# Patient Record
Sex: Male | Born: 1985 | Race: White | Hispanic: No | Marital: Single | Smoking: Former smoker
Health system: Southern US, Community
[De-identification: ages and names within clinical notes are randomized; demographics above are authoritative.]

## PROBLEM LIST (undated history)

## (undated) DIAGNOSIS — E785 Hyperlipidemia, unspecified: Secondary | ICD-10-CM

## (undated) HISTORY — DX: Hyperlipidemia, unspecified: E78.5

---

## 2018-06-14 ENCOUNTER — Ambulatory Visit: Payer: BLUE CROSS/BLUE SHIELD | Admitting: Neurology

## 2018-06-14 ENCOUNTER — Encounter: Payer: Self-pay | Admitting: Neurology

## 2018-06-14 VITALS — BP 136/88 | HR 92 | Ht 74.5 in | Wt 204.0 lb

## 2018-06-14 DIAGNOSIS — R0683 Snoring: Secondary | ICD-10-CM | POA: Diagnosis not present

## 2018-06-14 DIAGNOSIS — Z820 Family history of epilepsy and other diseases of the nervous system: Secondary | ICD-10-CM | POA: Diagnosis not present

## 2018-06-14 DIAGNOSIS — E663 Overweight: Secondary | ICD-10-CM

## 2018-06-14 DIAGNOSIS — R0681 Apnea, not elsewhere classified: Secondary | ICD-10-CM | POA: Diagnosis not present

## 2018-06-14 NOTE — Patient Instructions (Signed)

## 2018-06-14 NOTE — Progress Notes (Signed)
Subjective:    Patient ID: Gabriel Smith is a 32 y.o. male.  HPI     Huston Foley, MD, PhD [facility] [address], [address] [address] [place], [place] [address]  Dear Dr. Gwinda Passe,   I saw your patient, [name], upon your kind request, in my neurologic clinic today for initial consultation of his sleep disorder, in particular, concern for underlying obstructive sleep apnea. The patient is unaccompanied today. As you know, [name] is a 32 year old right-handed gentleman with an underlying medical history of hyperlipidemia and borderline overweight state, who reports snoring and excessive daytime somnolence as well as witnessed breathing pauses while asleep per girlfriend. I reviewed your office note from 04/08/2018. His parents have sleep apnea, his maternal grandfather has sleep apnea. His Epworth sleepiness score is 3 out of 24, fatigue score is 17 out of 63. He is single and lives alone. He is a IT trainer. He has no children. He smokes marijuana about 4 times per month, he occasionally smoked cigarettes in the past, quit smoking altogether in 2016. He drinks alcohol in the form of beer, approximately 24 beer per weekend. He drinks caffeine in the form of coffee, one cup per day on average. He denies restless leg symptoms. He denies morning headaches. He does report nocturia about once or twice per average night. He has been working on weight loss and has lost about 10 pounds in the past 6 months. He believes that his snoring has improved since then.  His Past Medical History Is Significant For: Past Medical History:  Diagnosis Date  . Hyperlipidemia     His Past Surgical History Is Significant For:  His Family History Is Significant For: Family History  Problem Relation Age of Onset  . Stroke Mother   . Depression Mother   . Sleep apnea Mother   . Heart attack Father   . Sleep apnea Father     His Social History Is Significant For: Social History    Socioeconomic History  . Marital status: Single    Spouse name: Not on file  . Number of children: Not on file  . Years of education: Not on file  . Highest education level: Not on file  Occupational History  . Not on file  Social Needs  . Financial resource strain: Not on file  . Food insecurity:    Worry: Not on file    Inability: Not on file  . Transportation needs:    Medical: Not on file    Non-medical: Not on file  Tobacco Use  . Smoking status: Former Smoker    Types: Cigarettes  . Smokeless tobacco: Never Used  Substance and Sexual Activity  . Alcohol use: Yes    Alcohol/week: 14.4 oz    Types: 24 Standard drinks or equivalent per week  . Drug use: Yes    Types: Marijuana  . Sexual activity: Not on file  Lifestyle  . Physical activity:    Days per week: Not on file    Minutes per session: Not on file  . Stress: Not on file  Relationships  . Social connections:    Talks on phone: Not on file    Gets together: Not on file    Attends religious service: Not on file    Active member of club or organization: Not on file    Attends meetings of clubs or organizations: Not on file    Relationship status: Not on file  Other Topics Concern  .  Not on file  Social History Narrative  . Not on file    His Allergies Are:  No Known Allergies:   His Current Medications Are:  Outpatient Encounter Medications as of 06/14/2018  Medication Sig  . atorvastatin (LIPITOR) 10 MG tablet Take 10 mg by mouth daily.  . Omega-3 Fatty Acids (FISH OIL) 1000 MG CAPS Take 1,000 mg by mouth daily.   No facility-administered encounter medications on file as of 06/14/2018.   :  Review of Systems:  Out of a complete 14 point review of systems, all are reviewed and negative with the exception of these symptoms as listed below:  Review of Systems  Neurological:       Pt presents today to discuss his sleep. Pt has never had a sleep study. Pt has been told that he stops breathing while  he is sleeping and that he occasionally snores.  Epworth Sleepiness Scale 0= would never doze 1= slight chance of dozing 2= moderate chance of dozing 3= high chance of dozing  Sitting and reading: 0 Watching TV: 0 Sitting inactive in a public place (ex. Theater or meeting): 0 As a passenger in a car for an hour without a break: 1 Lying down to rest in the afternoon: 1 Sitting and talking to someone: 0 Sitting quietly after lunch (no alcohol): 1 In a car, while stopped in traffic: 0 Total: 3     Objective:  Neurological Exam  Physical Exam Physical Examination:   Vitals:   06/14/18 1602  BP: 136/88  Pulse: 92   General Examination: The patient is a very pleasant 32 y.o. male in no acute distress. He appears well-developed and well-nourished and well groomed.   HEENT: Normocephalic, atraumatic, pupils are equal, round and reactive to light and accommodation. Extraocular tracking is good without limitation to gaze excursion or nystagmus noted. Normal smooth pursuit is noted. Hearing is grossly intact. Face is symmetric with normal facial animation and normal facial sensation. Speech is clear with no dysarthria noted. There is no hypophonia. There is no lip, neck/head, jaw or voice tremor. Neck is supple with full range of passive and active motion. There are no carotid bruits on auscultation. Oropharynx exam reveals: mild mouth dryness, good dental hygiene and mild airway crowding, due to smaller airway opening, tonsils are 1+. Mallampati is class II. Neck circumference is 15-1/4 inches. He has a minimal to mild overbite. tongue protrudes centrally and palate elevates symmetrically.   Chest: Clear to auscultation without wheezing, rhonchi or crackles noted.  Heart: S1+S2+0, regular and normal without murmurs, rubs or gallops noted.   Abdomen: Soft, non-tender and non-distended with normal bowel sounds appreciated on auscultation.  Extremities: There is no pitting edema in the  distal lower extremities bilaterally.   Skin: Warm and dry without trophic changes noted.  Musculoskeletal: exam reveals no obvious joint deformities, tenderness or joint swelling or erythema.   Neurologically:  Mental status: The patient is awake, alert and oriented in all 4 spheres. His immediate and remote memory, attention, language skills and fund of knowledge are appropriate. There is no evidence of aphasia, agnosia, apraxia or anomia. Speech is clear with normal prosody and enunciation. Thought process is linear. Mood is normal and affect is normal.  Cranial nerves II - XII are as described above under HEENT exam. In addition: shoulder shrug is normal with equal shoulder height noted. Motor exam: Normal bulk, strength and tone is noted. There is no drift, tremor or rebound. Romberg is negative. Reflexes are  2+ throughout. Fine motor skills and coordination: intact with normal finger taps, normal hand movements, normal rapid alternating patting, normal foot taps and normal foot agility.  Cerebellar testing: No dysmetria or intention tremor on finger to nose testing. Heel to shin is unremarkable bilaterally. There is no truncal or gait ataxia.  Sensory exam: intact to light touch in the upper and lower extremities.  Gait, station and balance: He stands easily. No veering to one side is noted. No leaning to one side is noted. Posture is age-appropriate and stance is narrow based. Gait shows normal stride length and normal pace. No problems turning are noted. Tandem walk is unremarkable.   Assessment and Plan:  In summary, Jarrin Staley is a very pleasant 32 y.o.-year old male with a history and physical exam concerning for obstructive sleep apnea (OSA). I had a long chat with the patient about my findings and the diagnosis of OSA, its prognosis and treatment options. We talked about medical treatments, surgical interventions and non-pharmacological approaches. I explained in particular the risks  and ramifications of untreated moderate to severe OSA, especially with respect to developing cardiovascular disease down the Road, including congestive heart failure, difficult to treat hypertension, cardiac arrhythmias, or stroke. Even type 2 diabetes has, in part, been linked to untreated OSA. Symptoms of untreated OSA include daytime sleepiness, memory problems, mood irritability and mood disorder such as depression and anxiety, lack of energy, as well as recurrent headaches, especially morning headaches. We talked about marijuana cessation and caution with regular alcohol intake, trying to maintain a healthy lifestyle in general, as well as the importance of weight control. I encouraged the patient to eat healthy, exercise daily and keep well hydrated, to keep a scheduled bedtime and wake time routine, to not skip any meals and eat healthy snacks in between meals. I advised the patient not to drive when feeling sleepy. I recommended the following at this time: sleep study with potential positive airway pressure titration. (We will score hypopneas at 3%).   I explained the sleep test procedure to the patient and also outlined possible surgical and non-surgical treatment options of OSA, including the use of a custom-made dental device (which would require a referral to a specialist dentist or oral surgeon), upper airway surgical options, such as pillar implants, radiofrequency surgery, tongue base surgery, and UPPP (which would involve a referral to an ENT surgeon). Rarely, jaw surgery such as mandibular advancement may be considered.  I also explained the CPAP treatment option to the patient, who indicated that he would be willing to try CPAP if the need arises. I explained the importance of being compliant with PAP treatment, not only for insurance purposes but primarily to improve His symptoms, and for the patient's long term health benefit, including to reduce His cardiovascular risks. I answered all his  questions today and the patient was in agreement. I plan to see him back after the sleep study is completed and encouraged him to call with any interim questions, concerns, problems or updates.   Thank you very much for allowing me to participate in the care of this nice patient. If I can be of any further assistance to you please do not hesitate to call me at [phone].  Sincerely,   Huston Foley, MD, PhD

## 2018-06-16 ENCOUNTER — Telehealth: Payer: Self-pay

## 2018-06-16 DIAGNOSIS — R0681 Apnea, not elsewhere classified: Secondary | ICD-10-CM

## 2018-06-16 NOTE — Telephone Encounter (Signed)
VO for HST from Dr. Athar received. HST order placed.  

## 2018-06-16 NOTE — Telephone Encounter (Signed)
[employer] denied in  Lab sleep study, Need HST order

## 2018-07-20 ENCOUNTER — Ambulatory Visit: Payer: BLUE CROSS/BLUE SHIELD | Admitting: Neurology

## 2018-07-20 DIAGNOSIS — R0683 Snoring: Secondary | ICD-10-CM

## 2018-07-20 DIAGNOSIS — G4710 Hypersomnia, unspecified: Secondary | ICD-10-CM

## 2018-07-20 DIAGNOSIS — R0681 Apnea, not elsewhere classified: Secondary | ICD-10-CM

## 2018-07-22 NOTE — Procedures (Signed)
[facility] [address] [place], [place] [address] NAME:   Gabriel Smith                                                               DOB: 09-20-1986 MEDICAL RECORD NUMBER [id number]                                           DOS:  07/20/18 REFERRING PHYSICIAN: Loyal Gambleryan Gilliam, MD STUDY PERFORMED: Home Sleep test HISTORY: 32 year old man with a history of hyperlipidemia and borderline overweight state, who reports snoring and excessive daytime somnolence as well as witnessed breathing pauses while asleep per girlfriend. His Epworth sleepiness score is 3 out of 24, BMI:26.2.  STUDY RESULTS:  Total Recording Time: 8 hours, 33 minutes Total Apnea/Hypopnea Index (AHI):  1.7/h, RDI:  4.4 /h Average Oxygen Saturation:   97 %, Lowest Oxygen Desaturation: 93 %  Total Time Oxygen Saturation Below or at 88 %: 0 minutes  Average Heart Rate:  62 bpm, Snoring events: 40  IMPRESSION: Snoring RECOMMENDATION: This home sleep test does not demonstrate any significant obstructive or central sleep disordered breathing with the exception of snoring, which appeared to be mild and intermittent. Other causes of the patient's symptoms, including circadian rhythm disturbances, an underlying mood disorder, medication effect and/or an underlying medical problem cannot be ruled out based on this test. Clinical correlation is recommended. The patient should be cautioned not to drive, work at heights, or operate dangerous or heavy equipment when tired or sleepy. Review and reiteration of good sleep hygiene measures should be pursued with any patient. The patient can follow up with his referring provider, who will be notified of the test results.   I certify that I have reviewed the raw data recording prior to the issuance of this report in accordance with the standards of the American Academy of Sleep Medicine (AASM).   Huston FoleySaima Krista Godsil, MD, PhD Diplomat, ABPN (Neurology and Sleep)

## 2018-07-22 NOTE — Progress Notes (Signed)
Patient referred by Dr. Gwinda PasseGilliam, seen by me on 06/14/18, HST on 07/20/18.    Please call and notify the patient that the recent home sleep test did not show any significant obstructive sleep apnea, mild snoring noted. Patient can follow up with the referring provider.   Thanks,  Huston FoleySaima Ramesses Crampton, MD, PhD [facility])

## 2018-07-25 ENCOUNTER — Telehealth: Payer: Self-pay

## 2018-07-25 NOTE — Telephone Encounter (Signed)
I called pt to discuss his sleep study results. No answer, left a message asking him to call me back. 

## 2018-07-25 NOTE — Telephone Encounter (Signed)
-----   Message from Huston FoleySaima Athar, MD sent at 07/22/2018 12:51 PM EDT ----- Patient referred by Dr. Gwinda PasseGilliam, seen by me on 06/14/18, HST on 07/20/18.    Please call and notify the patient that the recent home sleep test did not show any significant obstructive sleep apnea, mild snoring noted. Patient can follow up with the referring provider.   Thanks,  Huston FoleySaima Athar, MD, PhD [facility]

## 2018-07-25 NOTE — Telephone Encounter (Signed)
Pt returned my call. I advised him that his sleep study results did not show any significant osa, mild snoring was noticed. Pt can follow up with Dr. Gwinda PasseGilliam. Pt verbalized understanding of results. Pt had no questions at this time but was encouraged to call back if questions arise.

## 2020-04-22 ENCOUNTER — Ambulatory Visit (HOSPITAL_COMMUNITY)
Admission: EM | Admit: 2020-04-22 | Discharge: 2020-04-22 | Disposition: A | Discharge status: Home / Self Care | Payer: BC Managed Care – PPO | Attending: Family Medicine | Admitting: Family Medicine

## 2020-04-22 ENCOUNTER — Encounter (HOSPITAL_COMMUNITY): Payer: Self-pay

## 2020-04-22 ENCOUNTER — Other Ambulatory Visit: Payer: Self-pay

## 2020-04-22 DIAGNOSIS — L509 Urticaria, unspecified: Secondary | ICD-10-CM

## 2020-04-22 MED ORDER — METHYLPREDNISOLONE SODIUM SUCC 125 MG IJ SOLR
INTRAMUSCULAR | Status: AC
  Filled 2020-04-22: qty 2

## 2020-04-22 MED ORDER — METHYLPREDNISOLONE SODIUM SUCC 125 MG IJ SOLR
80.0000 mg | Freq: Once | INTRAMUSCULAR | Status: AC
  Administered 2020-04-22: 13:00:00 80 mg via INTRAMUSCULAR

## 2020-04-22 MED ORDER — PREDNISONE 10 MG (21) PO TBPK: ORAL_TABLET | ORAL | 0 refills | Status: DC

## 2020-04-22 NOTE — ED Triage Notes (Signed)
Patient reports rash that started on his left underarm on Thursday. Reports it had initially spread to his legs, and now is covering his back. Denies pain, just itching.

## 2020-04-22 NOTE — Discharge Instructions (Addendum)
We are giving you a steroid injection here.  If the rash is still present tomorrow or only mildly improving you can go ahead and start the steroid pack Cetirizine or Zyrtec daily for itching and Benadryl at night as needed Follow up as needed for continued or worsening symptoms

## 2020-04-22 NOTE — ED Provider Notes (Signed)
[place]    CSN: [id number] Arrival date & time: 04/22/20  1155      History   Chief Complaint Chief Complaint  Patient presents with  . Rash  . Pruritis    HPI Gabriel Smith is a 34 y.o. male.   Pt is a 34 year old male that presents today for possible allergic reaction.  Reporting approximately 4 to 5 days of worsening rash that started to left axilla.  Now his widespread to most of trunk area, arms and upper legs.  The only new thing that he has done is eat lobster and was taking these keto pills which he has been taking for weeks.  He has had shellfish in the past with no problems.  Denies any oral swelling, trouble swallowing or breathing.  Used over-the-counter hydrocortisone cream without any relief  Denies any fever, joint pain. Denies any recent changes in lotions, detergents, foods or other possible irritants. No recent travel. Nobody else at home has the rash. Patient has been outside but denies any contact with plants or insects.   ROS per HPI        Past Medical History:  Diagnosis Date  . Hyperlipidemia     There are no problems to display for this patient.   History reviewed. No pertinent surgical history.     Home Medications    Prior to Admission medications   Medication Sig Start Date End Date Taking? Authorizing Provider  atorvastatin (LIPITOR) 10 MG tablet Take 10 mg by mouth daily.    [provider]  Omega-3 Fatty Acids (FISH OIL) 1000 MG CAPS Take 1,000 mg by mouth daily.    [provider]  predniSONE (STERAPRED UNI-PAK 21 TAB) 10 MG (21) TBPK tablet 6 tabs for 1 day, then 5 tabs for 1 das, then 4 tabs for 1 day, then 3 tabs for 1 day, 2 tabs for 1 day, then 1 tab for 1 day 04/22/20   Orvan July, NP    Family History Family History  Problem Relation Age of Onset  . Stroke Mother   . Depression Mother   . Sleep apnea Mother   . Heart attack Father   . Sleep apnea Father     Social History Social  History   Tobacco Use  . Smoking status: Former Smoker    Types: Cigarettes  . Smokeless tobacco: Never Used  Substance Use Topics  . Alcohol use: Yes    Alcohol/week: 24.0 standard drinks    Types: 24 Standard drinks or equivalent per week    Comment: almost every weekend   . Drug use: Yes    Types: Marijuana     Allergies   Patient has no known allergies.   Review of Systems Review of Systems   Physical Exam Triage Vital Signs ED Triage Vitals  Enc Vitals Group     BP 04/22/20 1216 (!) 153/99     Pulse Rate 04/22/20 1216 (!) 113     Resp 04/22/20 1216 16     Temp 04/22/20 1216 98.5 F (36.9 C)     Temp Source 04/22/20 1216 Oral     SpO2 04/22/20 1216 99 %     Weight --      Height --      Head Circumference --      Peak Flow --      Pain Score 04/22/20 1215 0     Pain Loc --      Pain Edu? --  Excl. in GC? --    No data found.  Updated Vital Signs BP (!) 153/99 (BP Location: Right Arm)   Pulse (!) 113   Temp 98.5 F (36.9 C) (Oral)   Resp 16   SpO2 99%   Visual Acuity Right Eye Distance:   Left Eye Distance:   Bilateral Distance:    Right Eye Near:   Left Eye Near:    Bilateral Near:     Physical Exam Vitals and nursing note reviewed.  Constitutional:      Appearance: Normal appearance.  HENT:     Head: Normocephalic and atraumatic.     Nose: Nose normal.  Eyes:     Conjunctiva/sclera: Conjunctivae normal.  Pulmonary:     Effort: Pulmonary effort is normal.  Musculoskeletal:        General: Normal range of motion.     Cervical back: Normal range of motion.  Skin:    General: Skin is warm and dry.     Findings: Rash present.     Comments: Widespread urticaria  Neurological:     Mental Status: He is alert.  Psychiatric:        Mood and Affect: Mood normal.      UC Treatments / Results  Labs (all labs ordered are listed, but only abnormal results are displayed) Labs Reviewed - No data to display  EKG   Radiology No  results found.  Procedures Procedures (including critical care time)  Medications Ordered in UC Medications  methylPREDNISolone sodium succinate (SOLU-MEDROL) 125 mg/2 mL injection 80 mg (80 mg Intramuscular Given 04/22/20 1241)    Initial Impression / Assessment and Plan / UC Course  I have reviewed the triage vital signs and the nursing notes.  Pertinent labs & imaging results that were available during my care of the patient were reviewed by me and considered in my medical decision making (see chart for details).     Urticaria Most likely some sort of allergic reaction to unknown source. Steroid injection given here in clinic.  Prednisone pack sent to pharmacy to start tomorrow if the rash continues. Zyrtec as needed for itching and Benadryl for worsening itching Final Clinical Impressions(s) / UC Diagnoses   Final diagnoses:  Urticaria     Discharge Instructions     We are giving you a steroid injection here.  If the rash is still present tomorrow or only mildly improving you can go ahead and start the steroid pack Cetirizine or Zyrtec daily for itching and Benadryl at night as needed Follow up as needed for continued or worsening symptoms     ED Prescriptions    Medication Sig Dispense Auth. Provider   predniSONE (STERAPRED UNI-PAK 21 TAB) 10 MG (21) TBPK tablet 6 tabs for 1 day, then 5 tabs for 1 das, then 4 tabs for 1 day, then 3 tabs for 1 day, 2 tabs for 1 day, then 1 tab for 1 day 21 tablet Ryen Heitmeyer A, NP     PDMP not reviewed this encounter.   Janace Aris, NP 04/22/20 1538

## 2020-12-10 ENCOUNTER — Emergency Department: Payer: BC Managed Care – PPO

## 2020-12-10 ENCOUNTER — Other Ambulatory Visit: Payer: Self-pay

## 2020-12-10 ENCOUNTER — Encounter: Payer: Self-pay | Admitting: Emergency Medicine

## 2020-12-10 ENCOUNTER — Emergency Department
Admission: EM | Admit: 2020-12-10 | Discharge: 2020-12-10 | Disposition: A | Discharge status: Home / Self Care | Payer: BC Managed Care – PPO | Attending: Emergency Medicine | Admitting: Emergency Medicine

## 2020-12-10 DIAGNOSIS — U071 COVID-19: Secondary | ICD-10-CM | POA: Diagnosis not present

## 2020-12-10 DIAGNOSIS — R0602 Shortness of breath: Secondary | ICD-10-CM | POA: Diagnosis present

## 2020-12-10 DIAGNOSIS — Z87891 Personal history of nicotine dependence: Secondary | ICD-10-CM | POA: Insufficient documentation

## 2020-12-10 MED ORDER — ALBUTEROL SULFATE HFA 108 (90 BASE) MCG/ACT IN AERS: 2.0000 | INHALATION_SPRAY | RESPIRATORY_TRACT | 0 refills | Status: DC | PRN

## 2020-12-10 MED ORDER — PREDNISONE 10 MG PO TABS: 10.0000 mg | ORAL_TABLET | Freq: Every day | ORAL | 0 refills | Status: DC

## 2020-12-10 MED ORDER — IBUPROFEN 400 MG PO TABS
400.0000 mg | ORAL_TABLET | Freq: Once | ORAL | Status: AC
  Administered 2020-12-10: 400 mg via ORAL

## 2020-12-10 NOTE — ED Triage Notes (Signed)
Tested positive for COVID 12/9.  ARrives today for 'further testing' from telemedicine doctor because patient experiencing small amount of DOE when climbing stairs.  AAOx3.  Skin warm and dry. NAD

## 2020-12-10 NOTE — ED Provider Notes (Signed)
[facility] Emergency Department Provider Note  ____________________________________________  Time seen: Approximately 6:40 PM  I have reviewed the triage vital signs and the nursing notes.   HISTORY  Chief Complaint Shortness of Breath    HPI Gabriel Smith is a 34 y.o. male who presents the emergency department complaining of shortness of breath with activity in the setting of a positive Covid test.  Patient was diagnosed 5 days ago with Covid.  He has had some shortness of breath when climbing stairs or doing activity.  Patient contacted primary care through televisit and given the shortness of breath it was recommended that patient be seen in the emergency department.  He has no pleuritic pain.  Patient feels slightly ill but has no chest pain, frank difficulty breathing at rest, abdominal pain, nausea vomiting, diarrhea or constipation.         Past Medical History:  Diagnosis Date  . Hyperlipidemia     There are no problems to display for this patient.   History reviewed. No pertinent surgical history.  Prior to Admission medications   Medication Sig Start Date End Date Taking? Authorizing Provider  albuterol (VENTOLIN HFA) 108 (90 Base) MCG/ACT inhaler Inhale 2 puffs into the lungs every 4 (four) hours as needed for wheezing or shortness of breath. 12/10/20   Hitesh Fouche, Delorise Royals, PA-C  atorvastatin (LIPITOR) 10 MG tablet Take 10 mg by mouth daily.    [provider]  Omega-3 Fatty Acids (FISH OIL) 1000 MG CAPS Take 1,000 mg by mouth daily.    [provider]  predniSONE (DELTASONE) 10 MG tablet Take 1 tablet (10 mg total) by mouth daily. 12/10/20   Traeton Bordas, Delorise Royals, PA-C    Allergies Patient has no known allergies.  Family History  Problem Relation Age of Onset  . Stroke Mother   . Depression Mother   . Sleep apnea Mother   . Heart attack Father   . Sleep apnea Father     Social History Social History   Tobacco  Use  . Smoking status: Former Smoker    Types: Cigarettes  . Smokeless tobacco: Never Used  Vaping Use  . Vaping Use: Never used  Substance Use Topics  . Alcohol use: Yes    Alcohol/week: 24.0 standard drinks    Types: 24 Standard drinks or equivalent per week    Comment: almost every weekend   . Drug use: Yes    Types: Marijuana     Review of Systems  Constitutional: No fever/chills Eyes: No visual changes. No discharge ENT: No upper respiratory complaints. Cardiovascular: no chest pain. Respiratory: Positive cough.  Positive SOB with exertion. Gastrointestinal: No abdominal pain.  No nausea, no vomiting.  No diarrhea.  No constipation. Musculoskeletal: Negative for musculoskeletal pain. Skin: Negative for rash, abrasions, lacerations, ecchymosis. Neurological: Negative for headaches, focal weakness or numbness.  10 System ROS otherwise negative.  ____________________________________________   PHYSICAL EXAM:  VITAL SIGNS: ED Triage Vitals  Enc Vitals Group     BP 12/10/20 1644 124/71     Pulse Rate 12/10/20 1644 (!) 126     Resp 12/10/20 1644 20     Temp 12/10/20 1644 (!) 100.6 F (38.1 C)     Temp Source 12/10/20 1644 Oral     SpO2 12/10/20 1644 98 %     Weight 12/10/20 1641 203 lb 14.8 oz (92.5 kg)     Height 12/10/20 1641 6' 2.5" (1.892 m)     Head Circumference --  Peak Flow --      Pain Score 12/10/20 1641 0     Pain Loc --      Pain Edu? --      Excl. in GC? --      Constitutional: Alert and oriented. Well appearing and in no acute distress. Eyes: Conjunctivae are normal. PERRL. EOMI. Head: Atraumatic. ENT:      Ears:       Nose: No congestion/rhinnorhea.      Mouth/Throat: Mucous membranes are moist.  Neck: No stridor.    Cardiovascular: Normal rate, regular rhythm. Normal S1 and S2.  Good peripheral circulation. Respiratory: Normal respiratory effort without tachypnea or retractions. Lungs CTAB. Good air entry to the bases with no decreased  or absent breath sounds. Musculoskeletal: Full range of motion to all extremities. No gross deformities appreciated. Neurologic:  Normal speech and language. No gross focal neurologic deficits are appreciated.  Skin:  Skin is warm, dry and intact. No rash noted. Psychiatric: Mood and affect are normal. Speech and behavior are normal. Patient exhibits appropriate insight and judgement.   ____________________________________________   LABS (all labs ordered are listed, but only abnormal results are displayed)  Labs Reviewed - No data to display ____________________________________________  EKG   ____________________________________________  RADIOLOGY I personally viewed and evaluated these images as part of my medical decision making, as well as reviewing the written report by the radiologist.  ED Provider Interpretation: Positive for patchy opacities consistent with COVID-19  DG Chest 2 View  Result Date: 12/10/2020 CLINICAL DATA:  COVID short of breath EXAM: CHEST - 2 VIEW COMPARISON:  None. FINDINGS: Patchy peripheral and basilar airspace opacities. No pleural effusion. Normal heart size. No pneumothorax IMPRESSION: Patchy peripheral and basilar airspace opacities consistent with bilateral pneumonia and history of COVID positivity Electronically Signed   By: Jasmine Pang M.D.   On: 12/10/2020 17:37    ____________________________________________    PROCEDURES  Procedure(s) performed:    Procedures    Medications  ibuprofen (ADVIL) tablet 400 mg (400 mg Oral Given 12/10/20 1647)     ____________________________________________   INITIAL IMPRESSION / ASSESSMENT AND PLAN / ED COURSE  Pertinent labs & imaging results that were available during my care of the patient were reviewed by me and considered in my medical decision making (see chart for details).  Review of the [place] CSRS was performed in accordance of the [facility] prior to dispensing any controlled drugs.            Patient's diagnosis is consistent with COVID-19.  Patient presented to the emergency department after telemedicine referred him to the ED for exertional shortness of breath.  Patient is 5 days in from a positive COVID-19 diagnosis.  No shortness of breath at rest.  Patient is relatively asymptomatic other than shortness of breath with activity.  Chest x-ray was consistent with Covid.  Lung sounds are reassuring at this time.  I have a low suspicion for bacterial component, PE or ACS/STEMI.  No indication for labs or further imaging at this time.  Patient will be placed on steroid taper and albuterol for symptom relief.  Return precautions are discussed with the patient at length..  Follow-up primary care as needed.  Patient is given ED precautions to return to the ED for any worsening or new symptoms.     ____________________________________________  FINAL CLINICAL IMPRESSION(S) / ED DIAGNOSES  Final diagnoses:  COVID-19      NEW MEDICATIONS STARTED DURING THIS VISIT:  ED Discharge Orders  Ordered    predniSONE (DELTASONE) 10 MG tablet  Daily       Note to Pharmacy: Take 6 pills x 2 days, 5 pills x 2 days, 4 pills x 2 days, 3 pills x 2 days, 2 pills x 2 days, and 1 pill x 2 days   12/10/20 1901    albuterol (VENTOLIN HFA) 108 (90 Base) MCG/ACT inhaler  Every 4 hours PRN        12/10/20 1901              This chart was dictated using voice recognition software/Dragon. Despite best efforts to proofread, errors can occur which can change the meaning. Any change was purely unintentional.    Racheal Patches, PA-C 12/10/20 1901    Arnaldo Natal, MD 12/11/20 [phone]

## 2021-04-02 IMAGING — CR DG CHEST 2V
1 series · 2 of 2 positions shown · non-contrast
Comparison: None.

CLINICAL DATA: COVID short of breath

EXAM:
CHEST - 2 VIEW

[Series 1: dg chest 2 view · 0.14mm/px · 2 of 2 slices shown]
[im 1/2]
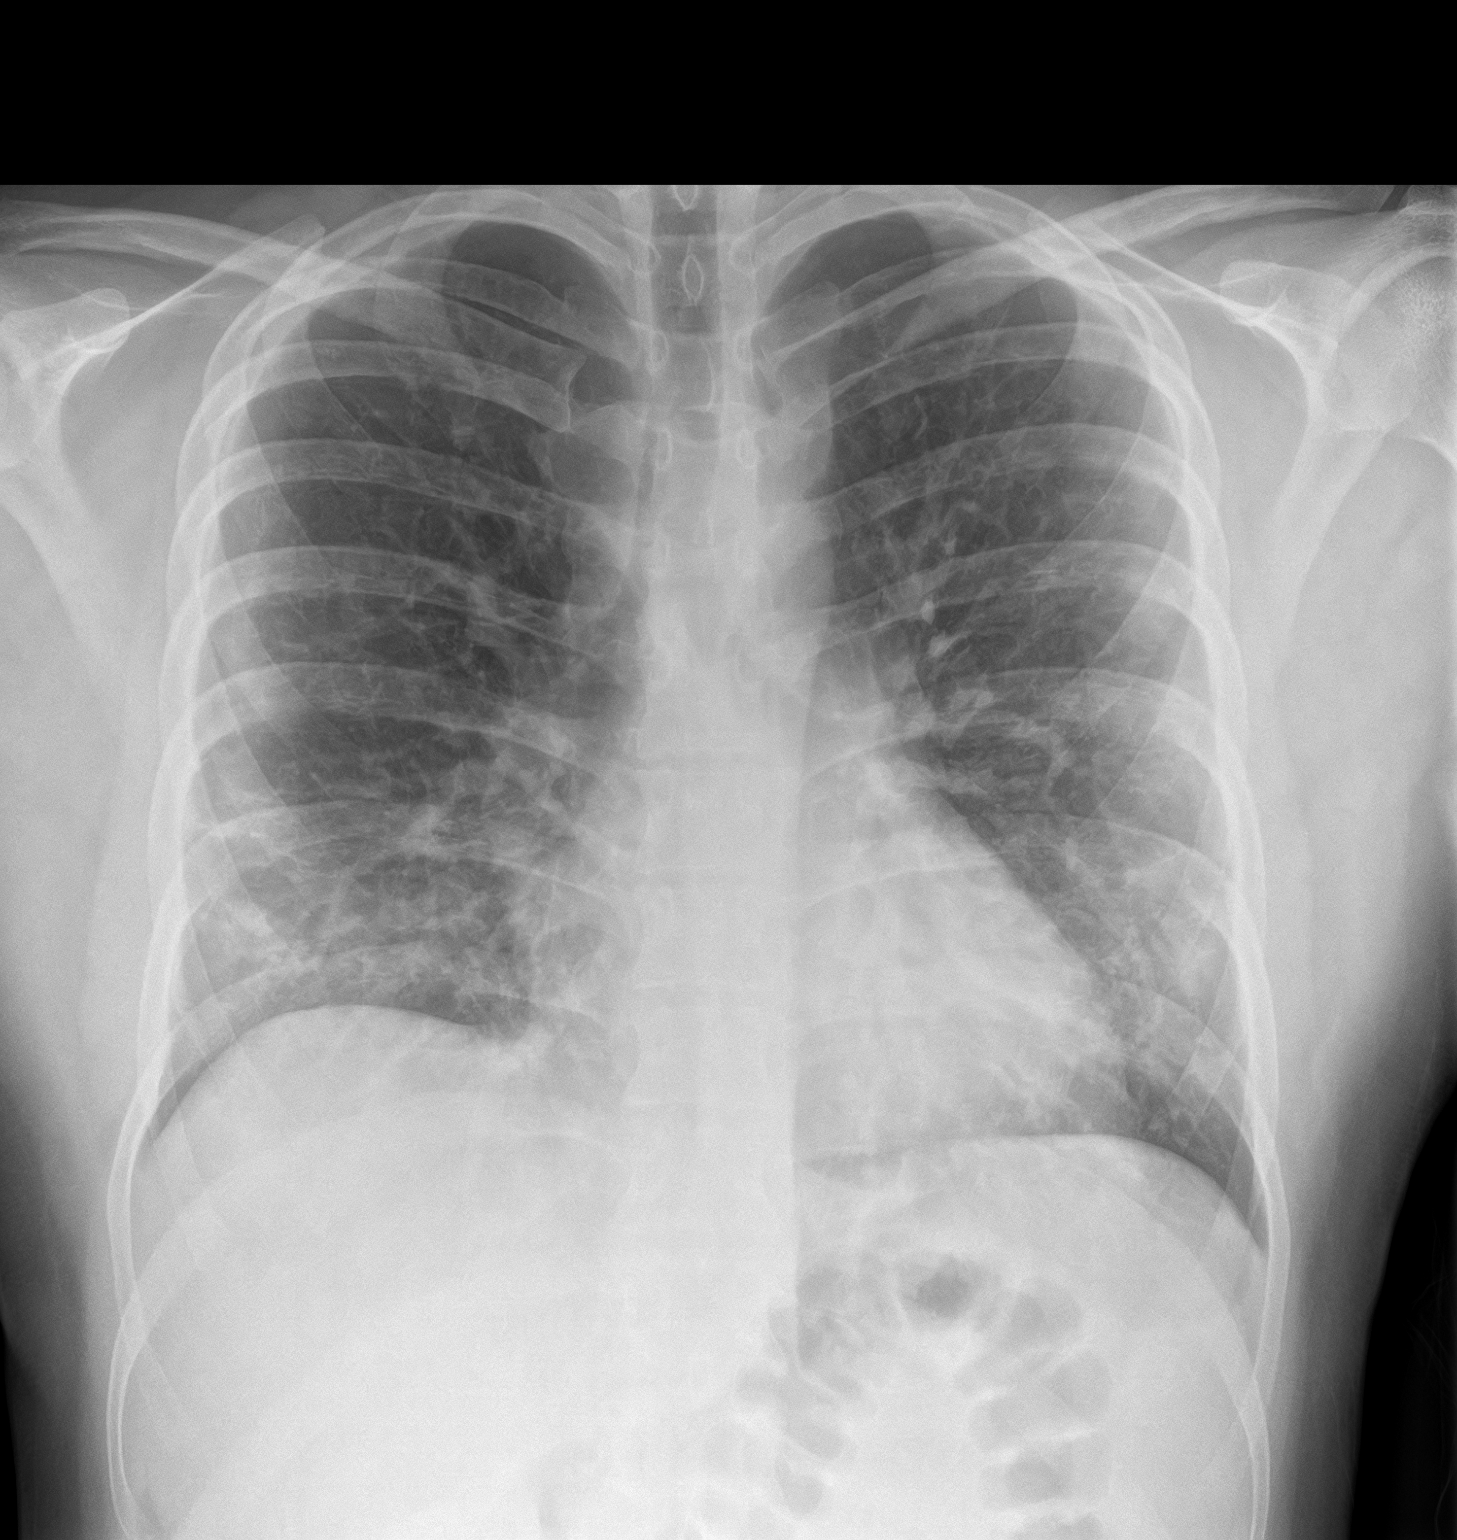
[im 2/2]
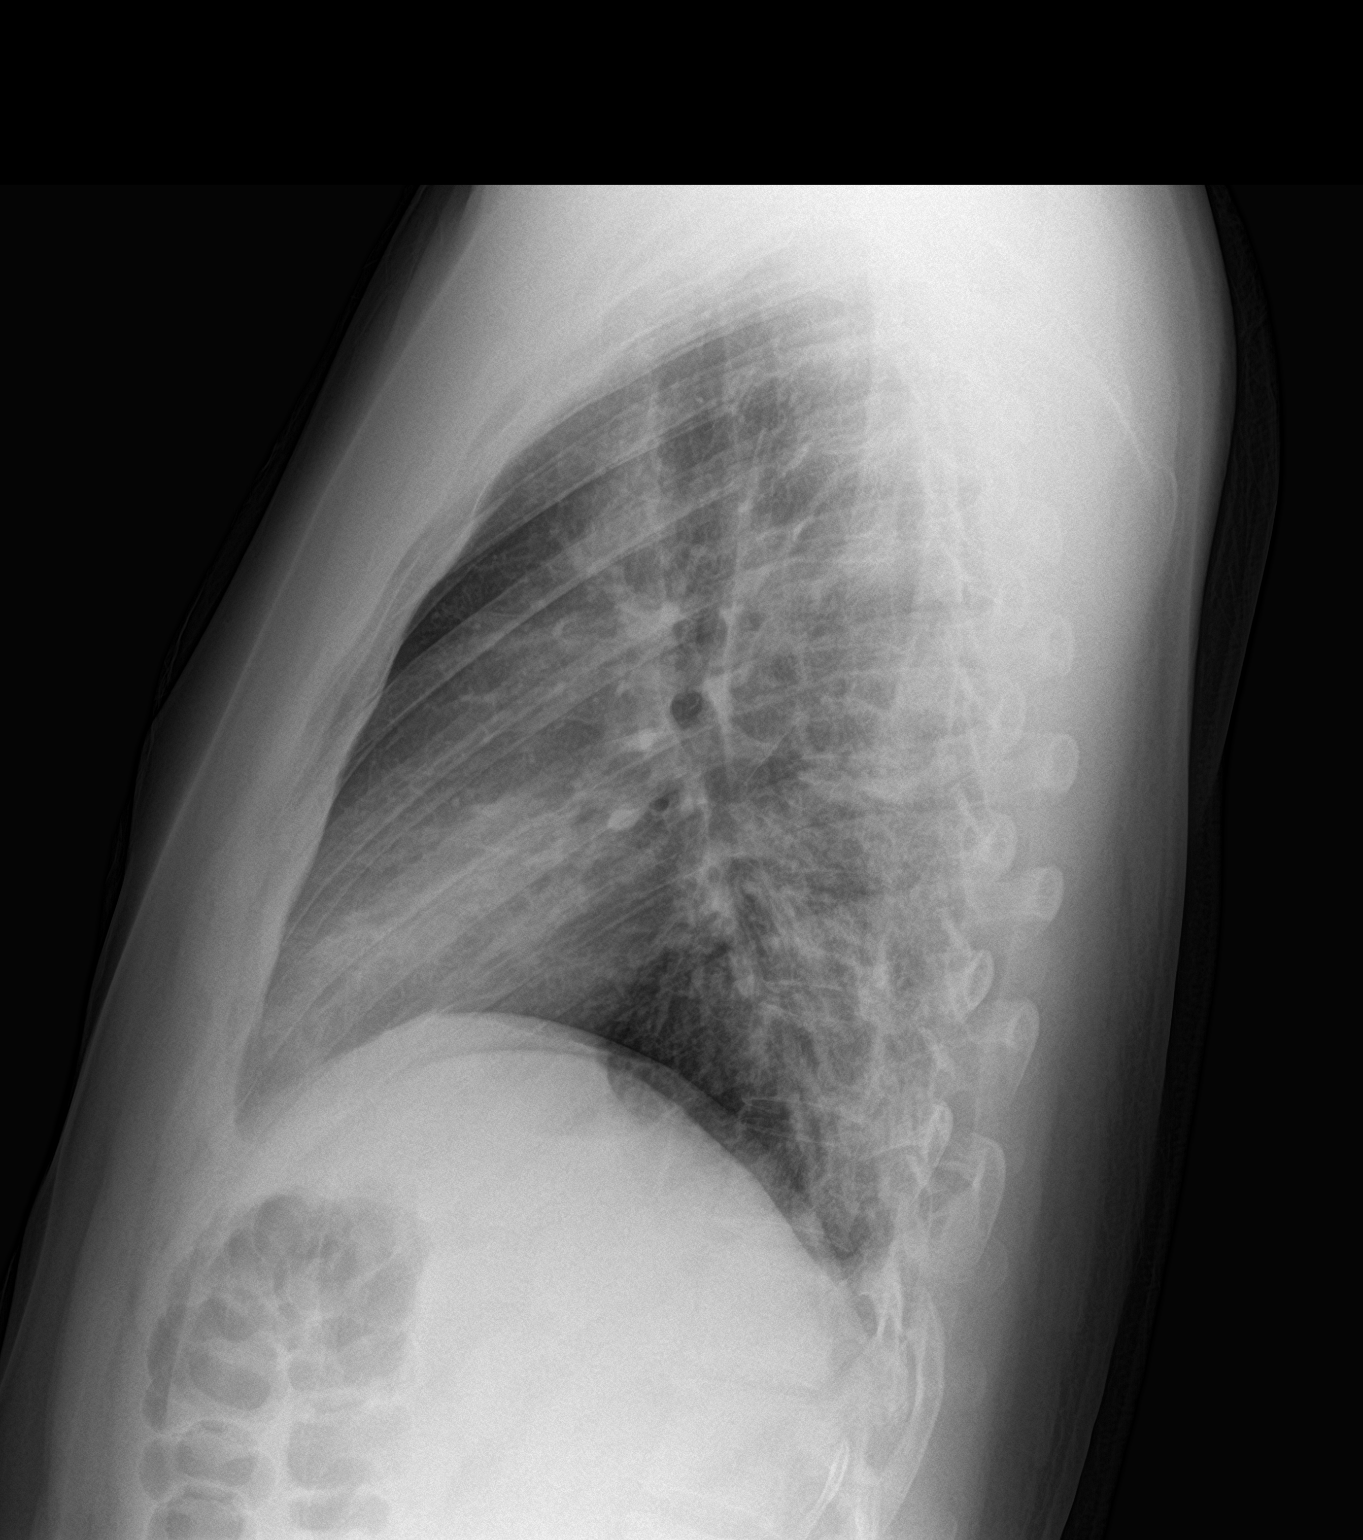

[2 of 2 positions shown; findings below may reference images not displayed]

FINDINGS: Patchy peripheral and basilar airspace opacities. No pleural
effusion. Normal heart size. No pneumothorax
IMPRESSION: Patchy peripheral and basilar airspace opacities consistent with
bilateral pneumonia and history of COVID positivity

## 2023-03-08 ENCOUNTER — Encounter: Payer: Self-pay | Admitting: Pulmonary Disease

## 2023-03-08 ENCOUNTER — Ambulatory Visit (INDEPENDENT_AMBULATORY_CARE_PROVIDER_SITE_OTHER): Payer: Managed Care, Other (non HMO) | Admitting: Pulmonary Disease

## 2023-03-08 VITALS — BP 128/74 | HR 86 | Ht 75.0 in | Wt 229.8 lb

## 2023-03-08 DIAGNOSIS — R053 Chronic cough: Secondary | ICD-10-CM | POA: Diagnosis not present

## 2023-03-08 MED ORDER — PANTOPRAZOLE SODIUM 40 MG PO TBEC: 40.0000 mg | DELAYED_RELEASE_TABLET | Freq: Every day | ORAL | 3 refills | Status: DC

## 2023-03-08 NOTE — Patient Instructions (Addendum)
It is nice to meet you  I am reassured that your chest x-ray was clear last month, this is good news  Based on your history of reflux, high suspicion for reflux related cough.  Please take pantoprazole 30 minutes before your first meal of the day.  Once a day.  This is to treat acid which may in turn reduce inflammation and improve cough if reflux is driver of cough.  Please take this for 4 weeks at a minimum.  If this is too expensive, please see me a message and I will then a more cost effective option.  If this is not helping in a few weeks, send me a message.  I can send in an inhaler to treat the cough.  It is possible something like asthma or allergies is driving cough and congestion in the lungs causing little bit of mucus in the morning.  Especially with your move to your current living environment around the time or shortly before your cough started.  Return to clinic as needed, keep me up to date with message on MyChart and if needed I can arrange follow-up with me in the future.

## 2023-03-08 NOTE — Progress Notes (Signed)
$'@Patient'q$  ID: Gabriel Smith, male    DOB: April 09, 1986, 37 y.o.   MRN: [id number]  Chief Complaint  Patient presents with   Consult    Pt is here for consult for prolonged cough. Pt states that this cough has been occurring for 3 plus years now. More noted in the morning time when he wakes up. Pt states sometimes cough is productive. Pt states unsure if any medication helps.     Referring provider: Armanda Heritage, NP  HPI:   37 y.o. man whom we are seeing for evaluation of chronic cough.  Note from referring provider reviewed.  Patient notes cough for 3+ years.  Moved to current living situation about 4 years ago.  Cough worse in the mornings.  Productive of scant phlegm.  A couple coughing fits during the day.  Several hours between.  Coughing fits last a few seconds.  Not very severe.  He notes pretty significant reflux symptoms.  These have improved over the last 3 months or so.  Notably, severity, frequency of cough also improved with improved symptoms.  He is on no medicines for reflux.  He is not sure if he had asthma as a child.  He states his grandfather said that when he played basketball he seemed to be more short of breath than other kids.  But no formal diagnosis.  Never used any medicines for asthma.  For workup of cough, he had chest x-ray 01/2023 on my review of results reveals clear lungs.  PMH: Hypertension, hyperlipidemia Surgical history: Reviewed, denies any Family history, CAD in father Social history: Never smoker, lives in [place], [place] / Pulmonary Flowsheets:   ACT:      No data to display          MMRC:     No data to display          Epworth:      No data to display          Tests:   FENO:  No results found for: "NITRICOXIDE"  PFT:     No data to display          WALK:      No data to display          Imaging: Personally reviewed and as per EMR No results found.  Lab Results: Reviewed via care  everywhere CBC No results found for: "WBC", "RBC", "HGB", "HCT", "PLT", "MCV", "MCH", "MCHC", "RDW", "LYMPHSABS", "MONOABS", "EOSABS", "BASOSABS"  BMET No results found for: "NA", "K", "CL", "CO2", "GLUCOSE", "BUN", "CREATININE", "CALCIUM", "GFRNONAA", "GFRAA"  BNP No results found for: "BNP"  ProBNP No results found for: "PROBNP"  Specialty Problems   None   No Known Allergies  Immunization History  Administered Date(s) Administered   Tdap 03/29/2011, 01/20/2022    Past Medical History:  Diagnosis Date   Hyperlipidemia     Tobacco History: Social History   Tobacco Use  Smoking Status Former   Types: Cigarettes  Smokeless Tobacco Never   Counseling given: Not Answered   Continue to not smoke  Outpatient Encounter Medications as of 03/08/2023  Medication Sig   albuterol (VENTOLIN HFA) 108 (90 Base) MCG/ACT inhaler Inhale into the lungs.   atorvastatin (LIPITOR) 10 MG tablet Take 10 mg by mouth daily.   Omega-3 Fatty Acids (FISH OIL) 1000 MG CAPS Take 1,000 mg by mouth daily.   pantoprazole (PROTONIX) 40 MG tablet Take 1 tablet (40 mg total) by mouth daily.   [DISCONTINUED]  albuterol (VENTOLIN HFA) 108 (90 Base) MCG/ACT inhaler Inhale 2 puffs into the lungs every 4 (four) hours as needed for wheezing or shortness of breath.   [DISCONTINUED] predniSONE (DELTASONE) 10 MG tablet Take 1 tablet (10 mg total) by mouth daily.   No facility-administered encounter medications on file as of 03/08/2023.     Review of Systems  Review of Systems  No chest pain with exertion.  No orthopnea or PND.  Comprehensive review of systems otherwise negative. Physical Exam  BP 128/74 (BP Location: Left Arm, Patient Position: Sitting, Cuff Size: Normal)   Pulse 86   Ht '6\' 3"'$  (1.905 m)   Wt 229 lb 12.8 oz (104.2 kg)   SpO2 98%   BMI 28.72 kg/m   Wt Readings from Last 5 Encounters:  03/08/23 229 lb 12.8 oz (104.2 kg)  12/10/20 203 lb 14.8 oz (92.5 kg)  06/14/18 204 lb (92.5  kg)    BMI Readings from Last 5 Encounters:  03/08/23 28.72 kg/m  12/10/20 25.83 kg/m  06/14/18 25.84 kg/m     Physical Exam General: Sitting in chair, no acute distress Eyes: EOMI, no icterus Neck: Supple, no JVP Pulmonary: Clear, normal work of breathing Abdomen: Nondistended, bowel sounds present MSK: No synovitis, no joint effusion Neuro: Normal gait, no weakness Psych: Normal mood, full affect   Assessment & Plan:   Chronic cough: Present for 3 years.  Unclear etiology.  Favor reflux given worsening symptoms over the last several months.  Now a bit better over the last few months.  Frequency of cough and severity of cough seems to improved with improved symptoms.  Not any medications.  Trial pantoprazole 40 mg daily for 4 weeks.  Assess response.  If improved can continue.  If not, plan to move forward with an inhaler, ICS/LABA therapy.  Possible asthma as a child.  Cough did start shortly after moving to current living situation, allergy/environmental change could be a signal of asthma.  Chest imaging is clear which is reassuring.  GERD: New prescription for pantoprazole as above.  Curiously, symptoms have improved the last 2 months with increase in statin dosing.  Currently not being treated for GERD.   Return if symptoms worsen or fail to improve.   Lanier Clam, MD 03/08/2023

## 2023-06-05 ENCOUNTER — Other Ambulatory Visit: Payer: Self-pay | Admitting: Pulmonary Disease

## 2023-09-09 ENCOUNTER — Other Ambulatory Visit: Payer: Self-pay | Admitting: Pulmonary Disease

## 2023-12-11 ENCOUNTER — Other Ambulatory Visit: Payer: Self-pay | Admitting: Pulmonary Disease
